# Patient Record
Sex: Male | Born: 1975 | Race: White | Hispanic: No | Marital: Married | Smoking: Never smoker
Health system: Southern US, Community
[De-identification: ages and names within clinical notes are randomized; demographics above are authoritative.]

## PROBLEM LIST (undated history)

## (undated) DIAGNOSIS — T7840XA Allergy, unspecified, initial encounter: Secondary | ICD-10-CM

## (undated) HISTORY — PX: HERNIA REPAIR: SHX51

## (undated) HISTORY — DX: Allergy, unspecified, initial encounter: T78.40XA

## (undated) HISTORY — PX: SPINE SURGERY: SHX786

---

## 2003-01-23 ENCOUNTER — Encounter: Payer: Self-pay | Admitting: Emergency Medicine

## 2003-01-23 ENCOUNTER — Emergency Department (HOSPITAL_COMMUNITY): Admission: EM | Admit: 2003-01-23 | Discharge: 2003-01-24 | Payer: Self-pay | Admitting: Emergency Medicine

## 2008-03-11 ENCOUNTER — Emergency Department (HOSPITAL_COMMUNITY): Admission: EM | Admit: 2008-03-11 | Discharge: 2008-03-11 | Payer: Self-pay | Admitting: Emergency Medicine

## 2008-11-07 ENCOUNTER — Ambulatory Visit: Payer: Self-pay | Admitting: Gastroenterology

## 2009-09-21 ENCOUNTER — Emergency Department: Payer: Self-pay | Admitting: Emergency Medicine

## 2011-08-04 ENCOUNTER — Emergency Department: Payer: Self-pay | Admitting: Emergency Medicine

## 2011-08-04 LAB — URINALYSIS, COMPLETE
Bacteria: NONE SEEN
Bilirubin,UR: NEGATIVE
Blood: NEGATIVE
Glucose,UR: NEGATIVE mg/dL (ref 0–75)
Ketone: NEGATIVE
Leukocyte Esterase: NEGATIVE
Nitrite: NEGATIVE
Ph: 8 (ref 4.5–8.0)
Protein: NEGATIVE
RBC,UR: NONE SEEN /HPF (ref 0–5)
Specific Gravity: 1.01 (ref 1.003–1.030)
Squamous Epithelial: NONE SEEN
WBC UR: 1 /HPF (ref 0–5)

## 2011-08-04 LAB — COMPREHENSIVE METABOLIC PANEL
Albumin: 4.3 g/dL (ref 3.4–5.0)
Alkaline Phosphatase: 85 U/L (ref 50–136)
Anion Gap: 9 (ref 7–16)
BUN: 13 mg/dL (ref 7–18)
Bilirubin,Total: 0.8 mg/dL (ref 0.2–1.0)
Calcium, Total: 9.1 mg/dL (ref 8.5–10.1)
Chloride: 105 mmol/L (ref 98–107)
Co2: 27 mmol/L (ref 21–32)
Creatinine: 0.75 mg/dL (ref 0.60–1.30)
EGFR (African American): >60
EGFR (Non-African Amer.): >60
Glucose: 90 mg/dL (ref 65–99)
Osmolality: 281 (ref 275–301)
Potassium: 3.8 mmol/L (ref 3.5–5.1)
SGOT(AST): 32 U/L (ref 15–37)
SGPT (ALT): 33 U/L
Sodium: 141 mmol/L (ref 136–145)
Total Protein: 7.5 g/dL (ref 6.4–8.2)

## 2011-08-04 LAB — LIPASE, BLOOD: Lipase: 84 U/L (ref 73–393)

## 2011-08-04 LAB — CBC
HCT: 45.2 % (ref 40.0–52.0)
HGB: 15.1 g/dL (ref 13.0–18.0)
MCH: 30.1 pg (ref 26.0–34.0)
MCHC: 33.5 g/dL (ref 32.0–36.0)
MCV: 90 fL (ref 80–100)
Platelet: 246 10*3/uL (ref 150–440)
RBC: 5.02 10*6/uL (ref 4.40–5.90)
RDW: 13.5 % (ref 11.5–14.5)
WBC: 7.8 10*3/uL (ref 3.8–10.6)

## 2011-08-21 ENCOUNTER — Ambulatory Visit: Payer: Self-pay | Admitting: Surgery

## 2014-02-07 ENCOUNTER — Ambulatory Visit: Payer: Self-pay | Admitting: Physician Assistant

## 2014-07-10 ENCOUNTER — Ambulatory Visit: Payer: Self-pay | Admitting: Family Medicine

## 2014-07-16 ENCOUNTER — Ambulatory Visit: Payer: Self-pay | Admitting: Vascular Surgery

## 2014-08-12 NOTE — Op Note (Signed)
PATIENT NAME:  Jesse Coffey, Jesse Coffey MR#:  [id number] DATE OF BIRTH:  [birth year]  DATE OF PROCEDURE:  08/21/2011  PREOPERATIVE DIAGNOSIS: Umbilical hernia.   POSTOPERATIVE DIAGNOSIS: Umbilical hernia.   OPERATION: Umbilical hernia repair.   ANESTHESIA: General.   SURGEON: Quentin Orealph Coffey. Ely, MD  OPERATIVE PROCEDURE: With the patient in the supine position after the induction of appropriate general anesthesia, the patient's abdomen was prepped with ChloraPrep and draped with sterile towels. A half-moon incision was made below the umbilicus and carried down through the subcutaneous tissue with electrocautery. The sac was dissected free from the umbilical skin without difficulty. There did appear to be some incarcerated preperitoneal fat. The sac was reduced without difficulty after cleaning the fascia in a circumferential manner. The defect was obliterated using a pants-over-vest closure of 0 Surgilon. The suture line was reinforced with a running suture of 0 Surgilon. The area was infiltrated with 0.25% Marcaine for postoperative pain control. Umbilical skin was reapproximated with 0 Vicryl in a figure-of-eight fashion. The skin was closed with running subcuticular suture of 3-0 Vicryl. Benzoin and Steri-Strips were applied. Compressive dressing was applied. The patient was returned to the recovery room having tolerated the procedure well. Sponge, instrument, and needle counts were correct times two in the operating room.   ____________________________ Carmie Endalph Coffey. Ely III, MD rle:bjt D: 08/21/2011 11:11:23 ET T: 08/21/2011 11:22:40 ET JOB#: [id number]  cc: Carmie Endalph Coffey. Ely III, MD, <Dictator> Durward MallardJoel B. Marguerite OleaMoffett, MD Quentin OreALPH Coffey ELY MD ELECTRONICALLY SIGNED 08/28/2011 7:25

## 2015-10-02 ENCOUNTER — Other Ambulatory Visit (HOSPITAL_COMMUNITY): Payer: Self-pay | Admitting: Orthopaedic Surgery

## 2015-10-02 ENCOUNTER — Other Ambulatory Visit (HOSPITAL_COMMUNITY): Payer: Self-pay | Admitting: Physician Assistant

## 2015-10-02 DIAGNOSIS — M4124 Other idiopathic scoliosis, thoracic region: Secondary | ICD-10-CM

## 2015-10-02 DIAGNOSIS — M5432 Sciatica, left side: Secondary | ICD-10-CM

## 2015-10-05 ENCOUNTER — Other Ambulatory Visit: Payer: Self-pay | Admitting: Orthopaedic Surgery

## 2015-10-05 DIAGNOSIS — M4124 Other idiopathic scoliosis, thoracic region: Secondary | ICD-10-CM

## 2015-10-05 DIAGNOSIS — M5432 Sciatica, left side: Secondary | ICD-10-CM

## 2015-10-07 ENCOUNTER — Other Ambulatory Visit: Payer: Self-pay | Admitting: Orthopaedic Surgery

## 2015-10-07 DIAGNOSIS — M5432 Sciatica, left side: Secondary | ICD-10-CM

## 2015-10-07 DIAGNOSIS — M4124 Other idiopathic scoliosis, thoracic region: Secondary | ICD-10-CM

## 2015-10-08 ENCOUNTER — Inpatient Hospital Stay: Admission: RE | Admit: 2015-10-08 | Payer: Self-pay | Source: Ambulatory Visit

## 2015-10-11 ENCOUNTER — Ambulatory Visit
Admission: RE | Admit: 2015-10-11 | Discharge: 2015-10-11 | Disposition: A | Discharge status: Home / Self Care | Payer: BLUE CROSS/BLUE SHIELD | Source: Ambulatory Visit | Attending: Orthopaedic Surgery | Admitting: Orthopaedic Surgery

## 2015-10-11 DIAGNOSIS — M4124 Other idiopathic scoliosis, thoracic region: Secondary | ICD-10-CM

## 2015-10-11 DIAGNOSIS — M5432 Sciatica, left side: Secondary | ICD-10-CM

## 2015-10-16 ENCOUNTER — Ambulatory Visit: Payer: Self-pay

## 2015-10-25 ENCOUNTER — Ambulatory Visit: Payer: Self-pay

## 2016-08-03 ENCOUNTER — Ambulatory Visit (INDEPENDENT_AMBULATORY_CARE_PROVIDER_SITE_OTHER): Payer: Worker's Compensation

## 2016-08-03 ENCOUNTER — Ambulatory Visit (INDEPENDENT_AMBULATORY_CARE_PROVIDER_SITE_OTHER): Payer: Worker's Compensation | Admitting: Urgent Care

## 2016-08-03 VITALS — BP 135/86 | HR 59 | Temp 97.7°F | Resp 17 | Ht 69.0 in | Wt 182.0 lb

## 2016-08-03 DIAGNOSIS — S40011A Contusion of right shoulder, initial encounter: Secondary | ICD-10-CM

## 2016-08-03 DIAGNOSIS — S46911A Strain of unspecified muscle, fascia and tendon at shoulder and upper arm level, right arm, initial encounter: Secondary | ICD-10-CM | POA: Diagnosis not present

## 2016-08-03 DIAGNOSIS — S20212A Contusion of left front wall of thorax, initial encounter: Secondary | ICD-10-CM

## 2016-08-03 DIAGNOSIS — M25511 Pain in right shoulder: Secondary | ICD-10-CM

## 2016-08-03 DIAGNOSIS — R0789 Other chest pain: Secondary | ICD-10-CM

## 2016-08-03 DIAGNOSIS — W19XXXA Unspecified fall, initial encounter: Secondary | ICD-10-CM

## 2016-08-03 MED ORDER — NAPROXEN SODIUM 550 MG PO TABS: 550.0000 mg | ORAL_TABLET | Freq: Two times a day (BID) | ORAL | 1 refills | Status: DC

## 2016-08-03 MED ORDER — KETOROLAC TROMETHAMINE 60 MG/2ML IM SOLN
60.0000 mg | Freq: Once | INTRAMUSCULAR | Status: AC
  Administered 2016-08-03: 60 mg via INTRAMUSCULAR

## 2016-08-03 MED ORDER — CYCLOBENZAPRINE HCL 5 MG PO TABS: 5.0000 mg | ORAL_TABLET | Freq: Three times a day (TID) | ORAL | 1 refills | Status: AC | PRN

## 2016-08-03 NOTE — Patient Instructions (Addendum)
Shoulder Pain Many things can cause shoulder pain, including:  An injury to the area.  Overuse of the shoulder.  Arthritis. The source of the pain can be:  Inflammation.  An injury to the shoulder joint.  An injury to a tendon, ligament, or bone. Follow these instructions at home: Take these actions to help with your pain:  Squeeze a soft ball or a foam pad as much as possible. This helps to keep the shoulder from swelling. It also helps to strengthen the arm.  Take over-the-counter and prescription medicines only as told by your health care provider.  If directed, apply ice to the area:  Put ice in a plastic bag.  Place a towel between your skin and the bag.  Leave the ice on for 20 minutes, 2-3 times per day. Stop applying ice if it does not help with the pain.  If you were given a shoulder sling or immobilizer:  Wear it as told.  Remove it to shower or bathe.  Move your arm as little as possible, but keep your hand moving to prevent swelling. Contact a health care provider if:  Your pain gets worse.  Your pain is not relieved with medicines.  New pain develops in your arm, hand, or fingers. Get help right away if:  Your arm, hand, or fingers:  Tingle.  Become numb.  Become swollen.  Become painful.  Turn white or blue. This information is not intended to replace advice given to you by your health care provider. Make sure you discuss any questions you have with your health care provider. Document Released: 01/14/2005 Document Revised: 12/01/2015 Document Reviewed: 07/30/2014 Elsevier Interactive Patient Education  2017 [employer]vier Inc[employer]     Chest Contusion, Adult A chest contusion is a deep bruise to the chest. Contusions are usually the result of a blunt injury to tissues under the skin. The injury can damage the small blood vessels under the skin, which causes bleeding under the skin. The skin overlying the contusion may turn blue, purple, or yellow.  Minor injuries may give you a painless contusion, but more severe contusions may stay painful and swollen for a few weeks. What are the causes? A contusion is usually caused by a hard hit (blow), trauma, or direct force to your chest, such as: A motor vehicle accident. Falls. Bicycle injuries. Contact sport injuries. What increases the risk? You may be at a higher risk for a chest contusion if you play a sport in which falls and contact are common, such as football or soccer. What are the signs or symptoms? Symptoms of this condition include: Chest swelling. Pain and tenderness of the chest. Discomfort with certain movements of the upper torso. Discoloration of the chest. The area may have redness and then turn blue, purple, or yellow. Discomfort when taking deep breaths. How is this diagnosed? A chest contusion is diagnosed from a physical exam and your medical history. An X-ray may be needed to determine if there were any associated injuries, such as broken bones (fractures). Sometimes other tests such as CT scans, ultrasounds, or MRIs may be needed if internal injuries are suspected. A test that shows the amount of oxygen in your blood (pulse oximetry) may be done if you have trouble breathing. How is this treated? Often, the best treatment for a chest contusion is resting and applying ice to the injured area. Deep-breathing exercises may be recommended to reduce the risk of pneumonia. Oxygen therapy may be given if you have trouble breathing  or have low oxygen levels. Over-the-counter medicines may also be recommended for pain control. Follow these instructions at home: If directed, apply ice to the injured area. Put ice in a plastic bag. Place a towel between your skin and the bag. Leave the ice on for 20 minutes, 2-3 times per day. Take over-the-counter and prescription medicines only as told by your health care provider. Do any deep-breathing exercises as told by your health care  provider, if this applies. Do not lie down flat on your back. Keep your head and chest raised (elevated) when you are resting or sleeping. Do not use any products that contain nicotine or tobacco, such as cigarettes and e-cigarettes. If you need help quitting, ask your health care provider. Do not lift anything that causes you discomfort or pain. Contact a health care provider if: Your swelling or pain is not relieved with medicines or treatment. You have increased bruising or swelling. You have pain that is getting worse. Your symptoms have not improved after one week. Get help right away if: You have a sudden, significant increase in pain. You have difficulty breathing. You have dizziness, weakness, or fainting. You have blood in your urine or stool. You cough up blood or you vomit blood. Summary A chest contusion is a deep bruise to the chest that is usually caused by a hard hit, trauma, or direct force to your chest. Treatment for a chest contusion may include resting and applying ice to the injured area. Contact a health care provider if you have problems breathing or if your pain does not improve with treatment. This information is not intended to replace advice given to you by your health care provider. Make sure you discuss any questions you have with your health care provider. Document Released: 12/30/2000 Document Revised: 01/02/2016 Document Reviewed: 01/02/2016 Elsevier Interactive Patient Education  2017 [employer].

## 2016-08-03 NOTE — Progress Notes (Signed)
MRN: [id number] DOB: [birth year]  Subjective:   Stevon Gough is a 41 y.o. male presenting for worker's comp visit.   Reports suffering a fall while at work today. Patient was on a ladder at 6 feet, tried to grab some cords to catch himself, arm was fully stretched but he fell anyway. Patient broke his fall on a cubicle, landing on right shoulder. He also made impact with left chest, back of right arm. Denies loss of consciousness, head trauma, confusion, blurred vision, weakness, bony deformity, swelling, redness.   Chadwick's medications list, allergies, past medical history and past surgical history were reviewed and excluded from this note due to being a worker's comp case.   Objective:   Vitals: BP 135/86 (BP Location: Right Arm, Patient Position: Sitting, Cuff Size: Normal)   Pulse (!) 59   Temp 97.7 F (36.5 C) (Oral)   Resp 17   Ht  (1.753 m)   Wt 182 lb (82.6 kg)   SpO2 99%   BMI 26.88 kg/m   Physical Exam  Constitutional: He is oriented to person, place, and time. He appears well-developed and well-nourished. He appears distressed (from his shoulder pain).  HENT:  Mouth/Throat: Oropharynx is clear and moist.  Eyes: EOM are normal. Pupils are equal, round, and reactive to light.  Neck: Normal range of motion. Neck supple.  Cardiovascular: Normal rate.   Pulmonary/Chest: Effort normal. He exhibits tenderness (over areas outlined with associated erythema). He exhibits no laceration, no crepitus, no edema, no deformity, no swelling and no retraction.    Musculoskeletal:       Right shoulder: He exhibits decreased range of motion, tenderness (over areas outlined), swelling (trace) and spasm (trapezius). He exhibits no deformity, no laceration and normal strength.       Cervical back: He exhibits normal range of motion, no tenderness, no bony tenderness, no swelling, no edema, no deformity, no laceration, no pain and no spasm.       Thoracic back: He exhibits  normal range of motion, no tenderness, no bony tenderness, no swelling, no edema, no deformity, no laceration, no pain, no spasm and normal pulse.       Arms: Neurological: He is alert and oriented to person, place, and time.  Skin: Skin is warm and dry. Capillary refill takes less than 2 seconds. No erythema.   Dg Ribs Unilateral W/chest Left  Result Date: 08/03/2016 CLINICAL DATA:  Left shoulder and rib pain secondary to a fall. Chest wall pain. EXAM: LEFT RIBS AND CHEST - 3+ VIEW COMPARISON:  Chest x-ray dated 07/10/2014 FINDINGS: The ribs appear normal. Heart size and pulmonary vascularity are normal and the lungs are clear. No new effusion or pneumothorax. Moderately severe thoracic scoliosis. IMPRESSION: No acute abnormalities. Electronically Signed   By: Francene Boyers M.D.   On: 08/03/2016 15:08   Dg Shoulder Right  Result Date: 08/03/2016 CLINICAL DATA:  Right shoulder pain.  The patient fell. EXAM: RIGHT SHOULDER - 2+ VIEW COMPARISON:  None. FINDINGS: There is no evidence of fracture or dislocation. There is no evidence of arthropathy or other focal bone abnormality. Soft tissues are unremarkable. IMPRESSION: Negative. Electronically Signed   By: Francene Boyers M.D.   On: 08/03/2016 15:08   Assessment and Plan :   1. Fall, initial encounter 2. Acute pain of right shoulder 3. Shoulder strain, right, initial encounter 4. Contusion of right shoulder, initial encounter 5. Chest wall pain 6. Rib contusion, left, initial encounter - Will hold out  from work. Patient will start conservative management. Return-to-clinic precautions discussed, patient verbalized understanding. Otherwise, f/u on 08/06/2016. Consider stat referral to ortho depending on symptom progression. Counseled on signs of concussion.  Wallis Bamberg, PA-C Primary Care at [facility] [phone] 08/03/2016 2:40 PM

## 2016-08-06 ENCOUNTER — Encounter: Payer: Self-pay | Admitting: Urgent Care

## 2016-08-06 ENCOUNTER — Ambulatory Visit (INDEPENDENT_AMBULATORY_CARE_PROVIDER_SITE_OTHER): Payer: Worker's Compensation | Admitting: Urgent Care

## 2016-08-06 VITALS — BP 120/80 | HR 71 | Temp 97.4°F | Resp 18 | Ht 69.29 in | Wt 182.6 lb

## 2016-08-06 DIAGNOSIS — S46911A Strain of unspecified muscle, fascia and tendon at shoulder and upper arm level, right arm, initial encounter: Secondary | ICD-10-CM | POA: Diagnosis not present

## 2016-08-06 DIAGNOSIS — M25511 Pain in right shoulder: Secondary | ICD-10-CM | POA: Diagnosis not present

## 2016-08-06 DIAGNOSIS — S20212D Contusion of left front wall of thorax, subsequent encounter: Secondary | ICD-10-CM

## 2016-08-06 DIAGNOSIS — S40011A Contusion of right shoulder, initial encounter: Secondary | ICD-10-CM

## 2016-08-06 DIAGNOSIS — R0789 Other chest pain: Secondary | ICD-10-CM | POA: Diagnosis not present

## 2016-08-06 DIAGNOSIS — W19XXXD Unspecified fall, subsequent encounter: Secondary | ICD-10-CM

## 2016-08-06 MED ORDER — MELOXICAM 7.5 MG PO TABS: 7.5000 mg | ORAL_TABLET | Freq: Every day | ORAL | 1 refills | Status: AC

## 2016-08-06 NOTE — Progress Notes (Signed)
    MRN: [id number] DOB: 1975/11/24  Subjective:   Jesse Coffey is a 41 y.o. male presenting for worker's comp visit. Last OV was 08/03/2016, seen for a fall. Started management for shoulder strain, shoulder contusion, rib contusion. Reports improvement in his pain. Tried Anaprox but had significant reflux with this so he stopped it. Has started taking Flexeril with good relief. He did return to work and his been supervising only. Denies headache, confusion, dizziness, n/v, difficulty with balance. He can move his shoulder much better but has popping and pain when he tries to move his shoulder up. Denies bony deformity, swelling, redness.   Shamon's medications list, allergies, past medical history and past surgical history were reviewed and excluded from this note due to being a worker's comp case.   Objective:   Vitals: BP 120/80 (BP Location: Right Arm, Patient Position: Sitting, Cuff Size: Small)   Pulse 71   Temp 97.4 F (36.3 C) (Oral)   Resp 18   Ht 5' 9.29" (1.76 m)   Wt 182 lb 9.6 oz (82.8 kg)   SpO2 97%   BMI 26.74 kg/m   Physical Exam  Constitutional: He is oriented to person, place, and time. He appears well-developed and well-nourished.  HENT:  Mouth/Throat: Oropharynx is clear and moist.  Eyes: EOM are normal. Pupils are equal, round, and reactive to light.  Neck: Normal range of motion. Neck supple.  Cardiovascular: Normal rate.   Pulmonary/Chest: Effort normal. He exhibits no tenderness, no bony tenderness, no laceration, no edema, no swelling and no retraction.    Musculoskeletal:       Right shoulder: He exhibits decreased range of motion (arm abduction is less than 90 degrees, decreased external rotation) and tenderness (with ROM testing, over AC joint and deltoid). He exhibits no bony tenderness, no swelling, no effusion, no crepitus, no deformity, no spasm and normal strength.       Cervical back: He exhibits spasm (over right trapezius). He exhibits  normal range of motion, no tenderness, no bony tenderness, no swelling, no edema and no deformity.       Arms: Neurological: He is alert and oriented to person, place, and time.  Skin: Skin is warm and dry.  Psychiatric: He has a normal mood and affect.    Assessment and Plan :   1. Fall, subsequent encounter 2. Acute pain of right shoulder 3. Shoulder strain, right, initial encounter 4. Contusion of right shoulder, initial encounter 5. Chest wall pain 6. Rib contusion, left, subsequent encounter - Improved. No signs of concussion or head injury. Will continue conservative management for his multiple musculoskeletal injuries. Return to work on light duty. Consider referral to ortho at follow up if shoulder pain persists. May need rule out of rotator cuff tear given nature of his fall/injury.  Wallis Bamberg, PA-C Primary Care at [facility] [phone] 08/06/2016 10:03 AM

## 2016-08-06 NOTE — Patient Instructions (Addendum)
Shoulder Pain Many things can cause shoulder pain, including:  An injury to the area.  Overuse of the shoulder.  Arthritis. The source of the pain can be:  Inflammation.  An injury to the shoulder joint.  An injury to a tendon, ligament, or bone. Follow these instructions at home: Take these actions to help with your pain:  Squeeze a soft ball or a foam pad as much as possible. This helps to keep the shoulder from swelling. It also helps to strengthen the arm.  Take over-the-counter and prescription medicines only as told by your health care provider.  If directed, apply ice to the area:  Put ice in a plastic bag.  Place a towel between your skin and the bag.  Leave the ice on for 20 minutes, 2-3 times per day. Stop applying ice if it does not help with the pain.  If you were given a shoulder sling or immobilizer:  Wear it as told.  Remove it to shower or bathe.  Move your arm as little as possible, but keep your hand moving to prevent swelling. Contact a health care provider if:  Your pain gets worse.  Your pain is not relieved with medicines.  New pain develops in your arm, hand, or fingers. Get help right away if:  Your arm, hand, or fingers:  Tingle.  Become numb.  Become swollen.  Become painful.  Turn white or blue. This information is not intended to replace advice given to you by your health care provider. Make sure you discuss any questions you have with your health care provider. Document Released: 01/14/2005 Document Revised: 12/01/2015 Document Reviewed: 07/30/2014 Elsevier Interactive Patient Education  2017 Elsevier Inc.     IF you received an x-ray today, you will receive an invoice from [facility]. Please contact [facility] at [phone] with questions or concerns regarding your invoice.   IF you received labwork today, you will receive an invoice from [facility]. Please contact [facility] at [phone] with  questions or concerns regarding your invoice.   Our billing staff will not be able to assist you with questions regarding bills from these companies.  You will be contacted with the lab results as soon as they are available. The fastest way to get your results is to activate your My Chart account. Instructions are located on the last page of this paperwork. If you have not heard from us regarding the results in 2 weeks, please contact this office.      

## 2016-08-13 ENCOUNTER — Encounter: Payer: Self-pay | Admitting: Urgent Care

## 2016-08-13 ENCOUNTER — Ambulatory Visit (INDEPENDENT_AMBULATORY_CARE_PROVIDER_SITE_OTHER): Payer: Worker's Compensation | Admitting: Urgent Care

## 2016-08-13 VITALS — BP 135/78 | HR 70 | Temp 97.4°F | Resp 18 | Ht 69.29 in | Wt 183.0 lb

## 2016-08-13 DIAGNOSIS — S40011D Contusion of right shoulder, subsequent encounter: Secondary | ICD-10-CM | POA: Diagnosis not present

## 2016-08-13 DIAGNOSIS — S46911D Strain of unspecified muscle, fascia and tendon at shoulder and upper arm level, right arm, subsequent encounter: Secondary | ICD-10-CM

## 2016-08-13 DIAGNOSIS — Z026 Encounter for examination for insurance purposes: Secondary | ICD-10-CM

## 2016-08-13 NOTE — Progress Notes (Signed)
   MRN: [id number] DOB: 1975/10/09  Subjective:   Jesse Coffey is a 41 y.o. male presenting for worker's comp visit. Last OV was 08/06/2016, continued conservative management for shoulder strain, contusion. Today, reports significant improvement. He has minimal right shoulder pain. He has started doing overhead activity at work, using a Regulatory affairs officer. Feels no discomfort with this. He is using meloxicam with good relief.     Jesse Coffey's medications list, allergies, past medical history and past surgical history were reviewed and excluded from this note due to being a worker's comp case.   Objective:   Vitals: BP 135/78   Pulse 70   Temp 97.4 F (36.3 C) (Oral)   Resp 18   Ht 5' 9.29" (1.76 m)   Wt 183 lb (83 kg)   SpO2 98%   BMI 26.80 kg/m   Physical Exam  Constitutional: He is oriented to person, place, and time. He appears well-developed and well-nourished.  Cardiovascular: Normal rate.   Pulmonary/Chest: Effort normal.  Musculoskeletal:       Right shoulder: He exhibits normal range of motion, no tenderness (negative Neer, Hawkins, Empty Beer Can tests), no bony tenderness, no swelling, no effusion, no crepitus, no deformity, no spasm and normal strength.  Neurological: He is alert and oriented to person, place, and time.   Assessment and Plan :   1. Right shoulder strain, subsequent encounter 2. Contusion of right shoulder, subsequent encounter 3. Encounter related to worker's compensation claim - Return to work without restrictions. No need for follow up unless symptoms do not fully resolve.  Jesse Bamberg, PA-C Primary Care at [facility] [phone] 08/13/2016 8:16 AM

## 2016-08-13 NOTE — Patient Instructions (Addendum)
Shoulder Pain Many things can cause shoulder pain, including:  An injury to the area.  Overuse of the shoulder.  Arthritis. The source of the pain can be:  Inflammation.  An injury to the shoulder joint.  An injury to a tendon, ligament, or bone. Follow these instructions at home: Take these actions to help with your pain:  Squeeze a soft ball or a foam pad as much as possible. This helps to keep the shoulder from swelling. It also helps to strengthen the arm.  Take over-the-counter and prescription medicines only as told by your health care provider.  If directed, apply ice to the area:  Put ice in a plastic bag.  Place a towel between your skin and the bag.  Leave the ice on for 20 minutes, 2-3 times per day. Stop applying ice if it does not help with the pain.  If you were given a shoulder sling or immobilizer:  Wear it as told.  Remove it to shower or bathe.  Move your arm as little as possible, but keep your hand moving to prevent swelling. Contact a health care provider if:  Your pain gets worse.  Your pain is not relieved with medicines.  New pain develops in your arm, hand, or fingers. Get help right away if:  Your arm, hand, or fingers:  Tingle.  Become numb.  Become swollen.  Become painful.  Turn white or blue. This information is not intended to replace advice given to you by your health care provider. Make sure you discuss any questions you have with your health care provider. Document Released: 01/14/2005 Document Revised: 12/01/2015 Document Reviewed: 07/30/2014 Elsevier Interactive Patient Education  2017 Elsevier Inc.     IF you received an x-ray today, you will receive an invoice from [facility]. Please contact [facility] at [phone] with questions or concerns regarding your invoice.   IF you received labwork today, you will receive an invoice from [facility]. Please contact [facility] at [phone] with  questions or concerns regarding your invoice.   Our billing staff will not be able to assist you with questions regarding bills from these companies.  You will be contacted with the lab results as soon as they are available. The fastest way to get your results is to activate your My Chart account. Instructions are located on the last page of this paperwork. If you have not heard from us regarding the results in 2 weeks, please contact this office.      

## 2017-08-16 ENCOUNTER — Other Ambulatory Visit: Payer: Self-pay | Admitting: Internal Medicine

## 2017-08-16 DIAGNOSIS — M25551 Pain in right hip: Secondary | ICD-10-CM

## 2017-08-16 DIAGNOSIS — M545 Low back pain: Secondary | ICD-10-CM

## 2017-08-16 DIAGNOSIS — Z8269 Family history of other diseases of the musculoskeletal system and connective tissue: Secondary | ICD-10-CM

## 2017-08-16 DIAGNOSIS — M25552 Pain in left hip: Secondary | ICD-10-CM

## 2017-08-28 ENCOUNTER — Ambulatory Visit: Admission: RE | Admit: 2017-08-28 | Payer: BLUE CROSS/BLUE SHIELD | Source: Ambulatory Visit

## 2018-06-19 IMAGING — DX DG RIBS W/ CHEST 3+V*L*
4 series · 4 of 4 positions shown · non-contrast
Comparison: Chest x-ray dated 07/10/2014

CLINICAL DATA: Left shoulder and rib pain secondary to a fall.
Chest wall pain.

EXAM:
LEFT RIBS AND CHEST - 3+ VIEW

[chest pa]
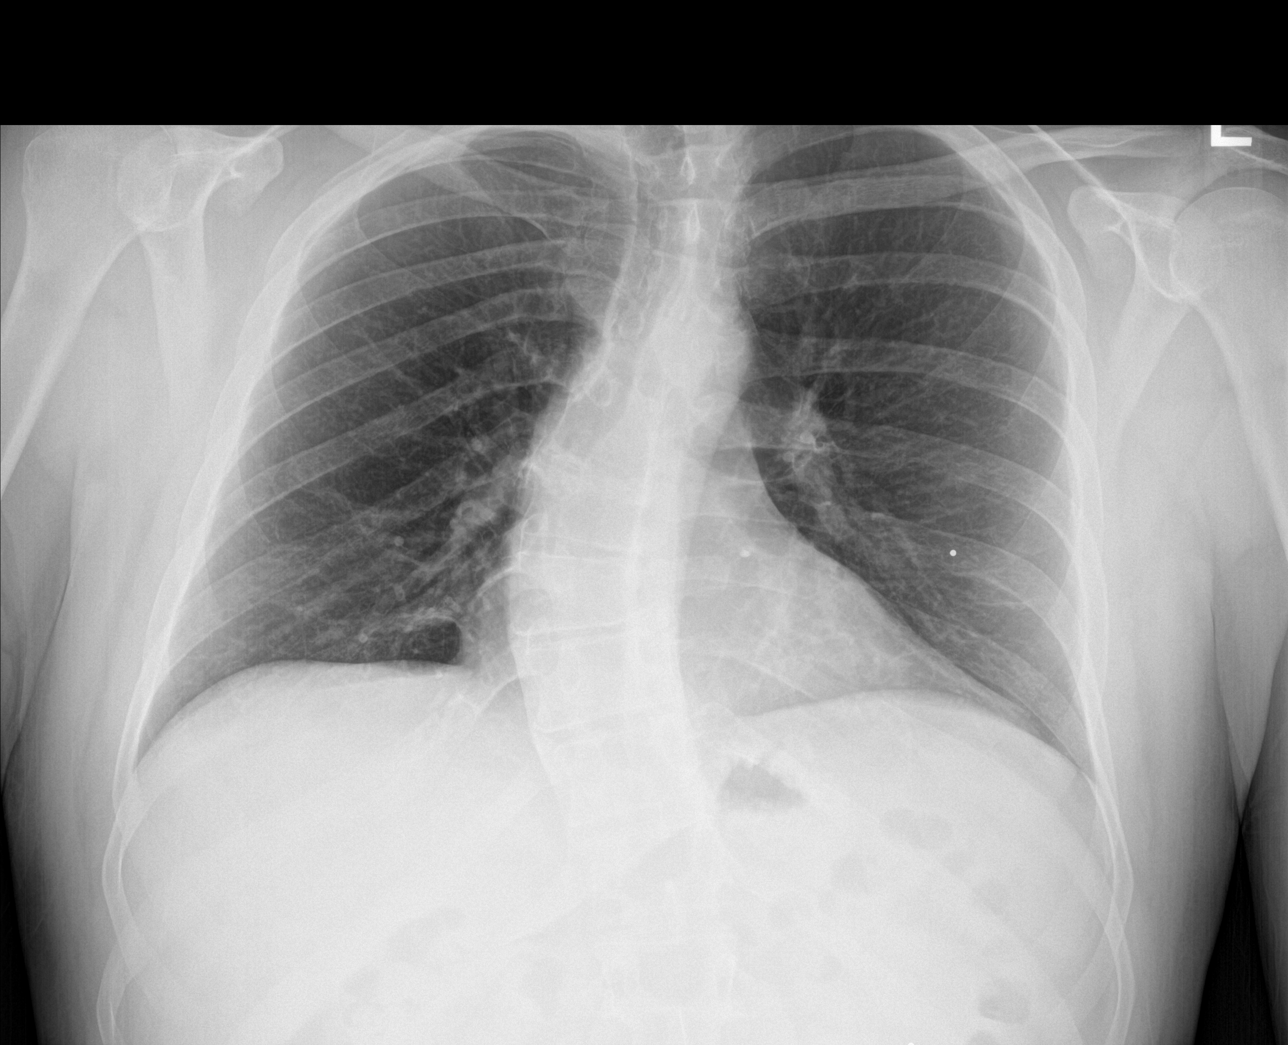

[rib obl (1 of 2)]
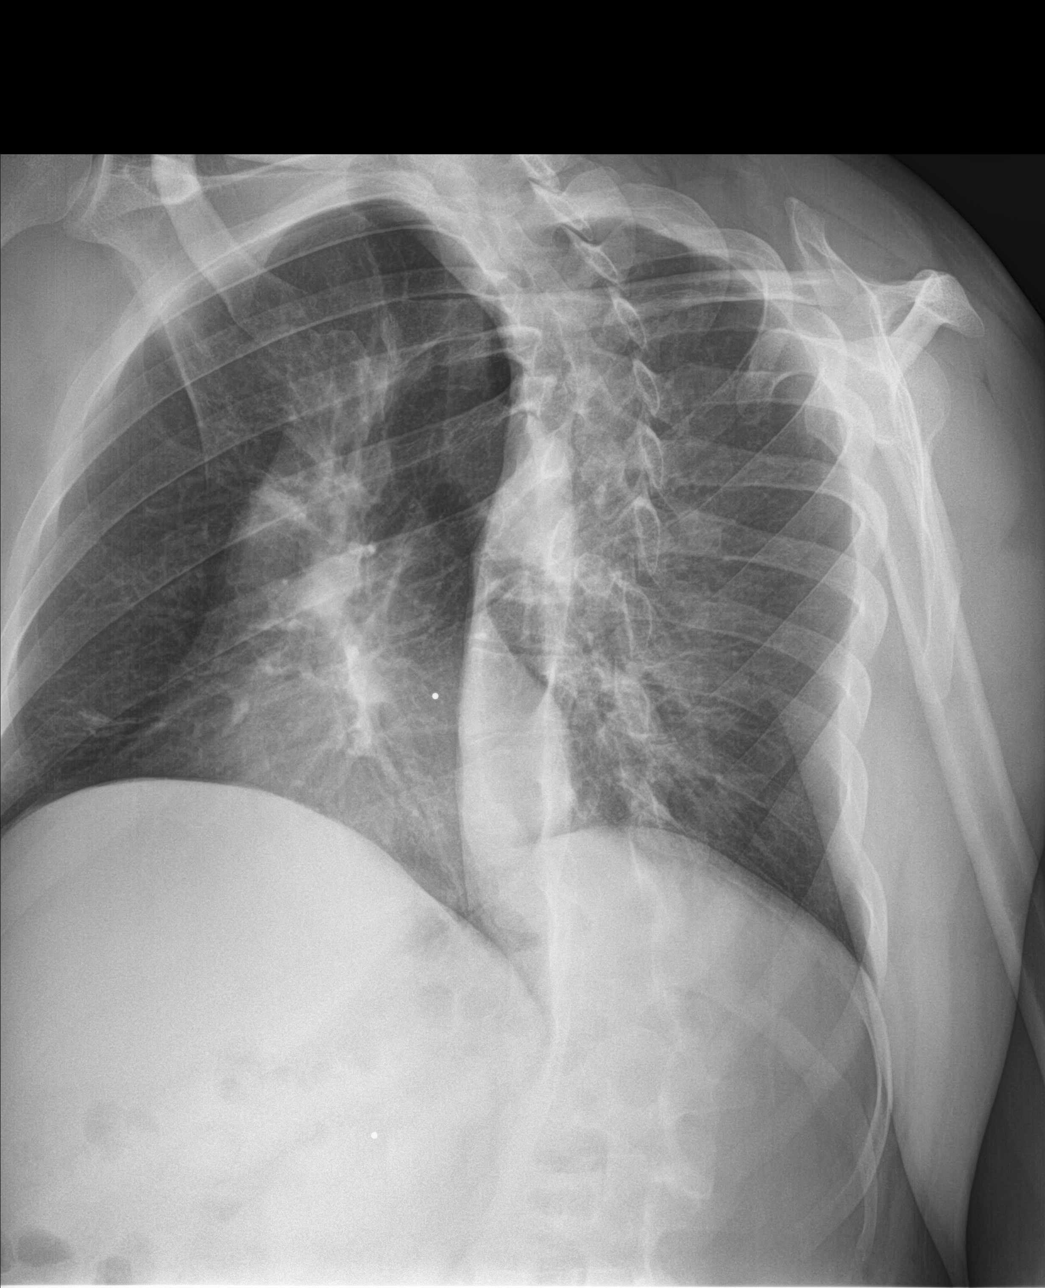

[rib obl (2 of 2)]
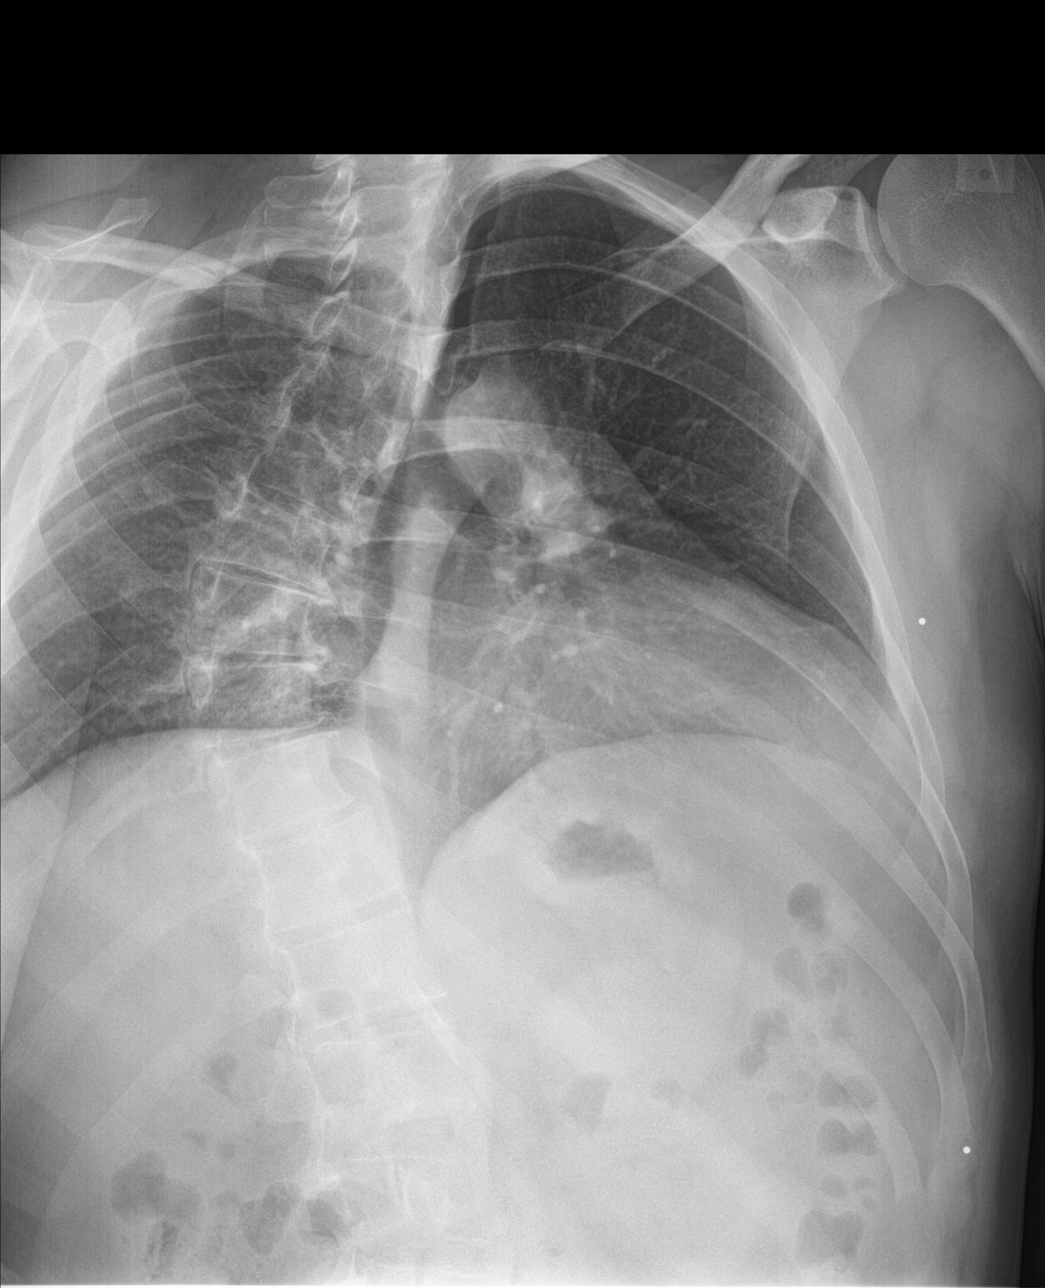

[chest ap]
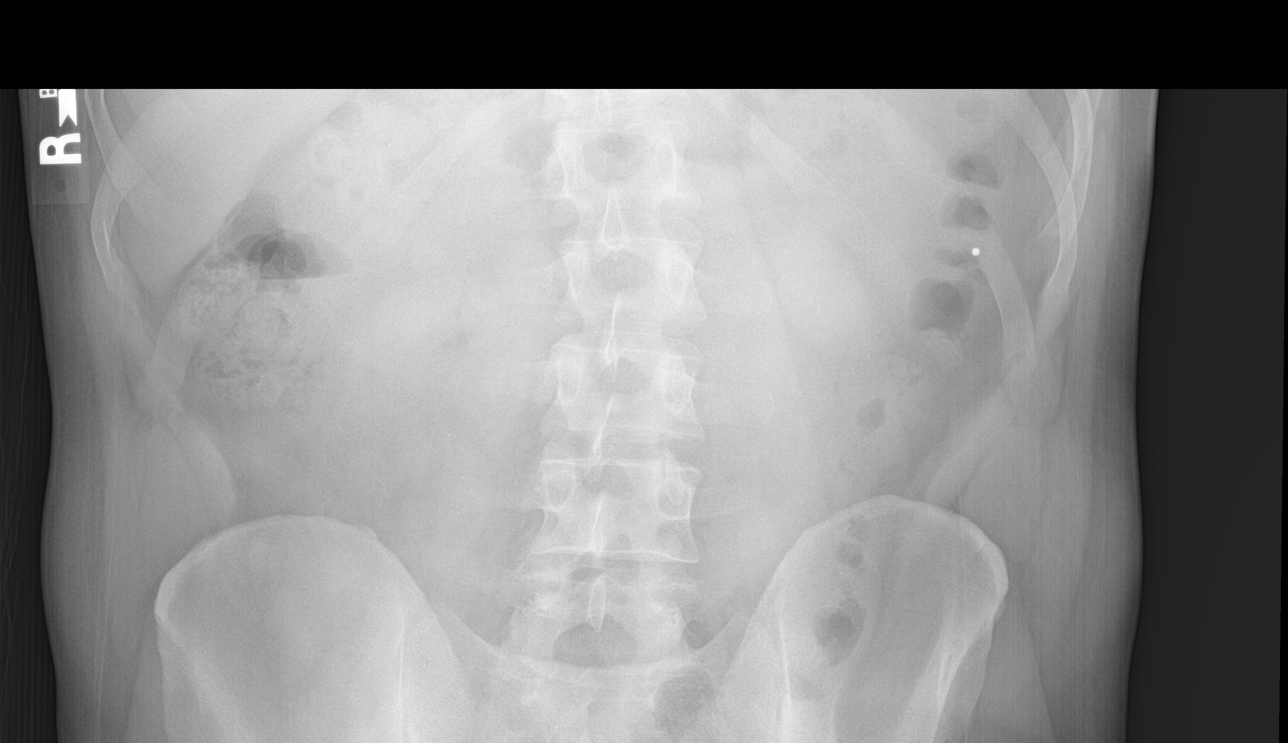

[4 of 4 positions shown; findings below may reference images not displayed]

FINDINGS: The ribs appear normal. Heart size and pulmonary vascularity are
normal and the lungs are clear. No new effusion or pneumothorax.
Moderately severe thoracic scoliosis.
IMPRESSION: No acute abnormalities.
